# Patient Record
Sex: Female | Born: 1980 | Race: White | Hispanic: No | Marital: Single | Smoking: Never smoker
Health system: Southern US, Community
[De-identification: ages and names within clinical notes are randomized; demographics above are authoritative.]

---

## 2016-06-13 ENCOUNTER — Encounter (HOSPITAL_BASED_OUTPATIENT_CLINIC_OR_DEPARTMENT_OTHER): Payer: Self-pay | Admitting: Emergency Medicine

## 2016-06-13 ENCOUNTER — Emergency Department (HOSPITAL_BASED_OUTPATIENT_CLINIC_OR_DEPARTMENT_OTHER): Payer: Self-pay

## 2016-06-13 ENCOUNTER — Emergency Department (HOSPITAL_BASED_OUTPATIENT_CLINIC_OR_DEPARTMENT_OTHER)
Admission: EM | Admit: 2016-06-13 | Discharge: 2016-06-13 | Disposition: A | Discharge status: Home / Self Care | Payer: Self-pay | Attending: Emergency Medicine | Admitting: Emergency Medicine

## 2016-06-13 DIAGNOSIS — N201 Calculus of ureter: Secondary | ICD-10-CM | POA: Insufficient documentation

## 2016-06-13 LAB — COMPREHENSIVE METABOLIC PANEL
ALK PHOS: 73 U/L (ref 38–126)
ALT: 79 U/L — ABNORMAL HIGH (ref 14–54)
ANION GAP: 8 (ref 5–15)
AST: 37 U/L (ref 15–41)
Albumin: 4.3 g/dL (ref 3.5–5.0)
BUN: 14 mg/dL (ref 6–20)
CALCIUM: 9.2 mg/dL (ref 8.9–10.3)
CO2: 26 mmol/L (ref 22–32)
CREATININE: 0.79 mg/dL (ref 0.44–1.00)
Chloride: 104 mmol/L (ref 101–111)
Glucose, Bld: 142 mg/dL — ABNORMAL HIGH (ref 65–99)
Potassium: 4.2 mmol/L (ref 3.5–5.1)
SODIUM: 138 mmol/L (ref 135–145)
TOTAL PROTEIN: 7.4 g/dL (ref 6.5–8.1)
Total Bilirubin: 0.9 mg/dL (ref 0.3–1.2)

## 2016-06-13 LAB — CBC
HCT: 42.2 % (ref 36.0–46.0)
HEMOGLOBIN: 14.3 g/dL (ref 12.0–15.0)
MCH: 30.2 pg (ref 26.0–34.0)
MCHC: 33.9 g/dL (ref 30.0–36.0)
MCV: 89.2 fL (ref 78.0–100.0)
PLATELETS: 184 10*3/uL (ref 150–400)
RBC: 4.73 MIL/uL (ref 3.87–5.11)
RDW: 12.9 % (ref 11.5–15.5)
WBC: 7.3 10*3/uL (ref 4.0–10.5)

## 2016-06-13 LAB — URINALYSIS, ROUTINE W REFLEX MICROSCOPIC
BILIRUBIN URINE: NEGATIVE
Glucose, UA: NEGATIVE mg/dL
KETONES UR: NEGATIVE mg/dL
Leukocytes, UA: NEGATIVE
NITRITE: NEGATIVE
PH: 6 (ref 5.0–8.0)
Protein, ur: NEGATIVE mg/dL
Specific Gravity, Urine: 1.025 (ref 1.005–1.030)

## 2016-06-13 LAB — URINALYSIS, MICROSCOPIC (REFLEX)

## 2016-06-13 LAB — PREGNANCY, URINE: Preg Test, Ur: NEGATIVE

## 2016-06-13 LAB — LIPASE, BLOOD: Lipase: 18 U/L (ref 11–51)

## 2016-06-13 MED ORDER — HYDROCODONE-ACETAMINOPHEN 5-325 MG PO TABS: 1.0000 | ORAL_TABLET | Freq: Four times a day (QID) | ORAL | 0 refills | Status: AC | PRN

## 2016-06-13 MED ORDER — SODIUM CHLORIDE 0.9 % IV SOLN: INTRAVENOUS | Status: DC

## 2016-06-13 MED ORDER — SODIUM CHLORIDE 0.9 % IV BOLUS (SEPSIS)
1000.0000 mL | Freq: Once | INTRAVENOUS | Status: AC
  Administered 2016-06-13: 1000 mL via INTRAVENOUS

## 2016-06-13 MED ORDER — ONDANSETRON 4 MG PO TBDP: 4.0000 mg | ORAL_TABLET | Freq: Three times a day (TID) | ORAL | 1 refills | Status: AC | PRN

## 2016-06-13 MED ORDER — HYDROMORPHONE HCL 1 MG/ML IJ SOLN
1.0000 mg | Freq: Once | INTRAMUSCULAR | Status: AC
  Administered 2016-06-13: 1 mg via INTRAVENOUS
  Filled 2016-06-13: qty 1

## 2016-06-13 MED ORDER — NAPROXEN 500 MG PO TABS: 500.0000 mg | ORAL_TABLET | Freq: Two times a day (BID) | ORAL | 0 refills | Status: AC

## 2016-06-13 MED ORDER — ONDANSETRON HCL 4 MG/2ML IJ SOLN
4.0000 mg | Freq: Once | INTRAMUSCULAR | Status: AC
  Administered 2016-06-13: 4 mg via INTRAVENOUS
  Filled 2016-06-13: qty 2

## 2016-06-13 MED FILL — NAPROXEN 500 MG TABLET: 500 | 7 days supply | Qty: 14 | Fill #0

## 2016-06-13 MED FILL — ONDANSETRON ODT 4 MG TABLET: 4 | 4 days supply | Qty: 10 | Fill #0

## 2016-06-13 MED FILL — HYDROCODON-APAP 5-325: 5-325 | 2 days supply | Qty: 10 | Fill #0

## 2016-06-13 NOTE — ED Provider Notes (Signed)
[facility] Provider Note   CSN: [id number] Arrival date & time: 06/13/16  4098     History   Chief Complaint Chief Complaint  Patient presents with  . Abdominal Pain    HPI Alicia Nolan is a 36 y.o. female.  Patient with acute onset of right lower quadrant abdominal pain is 6 this morning that radiates to the back. No true flank pain. Associated with several episodes of nausea and vomiting. Patient denies any blood in the urine but does have an urgency feeling. No known history of kidney stones in the past. Pain is 8 out of 10.      History reviewed. No pertinent past medical history.  There are no active problems to display for this patient.   History reviewed. No pertinent surgical history.  OB History    No data available       Home Medications    Prior to Admission medications   Medication Sig Start Date End Date Taking? Authorizing Provider  HYDROcodone-acetaminophen (NORCO/VICODIN) 5-325 MG tablet Take 1-2 tablets by mouth every 6 (six) hours as needed for moderate pain. 06/13/16   Vanetta Mulders, MD  naproxen (NAPROSYN) 500 MG tablet Take 1 tablet (500 mg total) by mouth 2 (two) times daily. 06/13/16   Vanetta Mulders, MD  ondansetron (ZOFRAN ODT) 4 MG disintegrating tablet Take 1 tablet (4 mg total) by mouth every 8 (eight) hours as needed for nausea or vomiting. 06/13/16   Vanetta Mulders, MD    Family History No family history on file.  Social History Social History  Substance Use Topics  . Smoking status: Never Smoker  . Smokeless tobacco: Never Used  . Alcohol use Yes     Comment: weekly     Allergies   Patient has no known allergies.   Review of Systems Review of Systems  Constitutional: Negative for fever.  HENT: Negative for congestion.   Eyes: Negative for visual disturbance.  Respiratory: Negative for shortness of breath.   Cardiovascular: Negative for chest pain.  Gastrointestinal: Positive for abdominal pain, nausea  and vomiting.  Genitourinary: Positive for urgency. Negative for dysuria, frequency and hematuria.  Musculoskeletal: Positive for back pain.  Skin: Negative for rash.  Neurological: Negative for headaches.  Hematological: Does not bruise/bleed easily.  Psychiatric/Behavioral: Negative for confusion.     Physical Exam Updated Vital Signs BP (!) 145/73   Pulse 63   Temp 97.9 F (36.6 C) (Oral)   Resp 18   Ht  (1.702 m)   Wt 113.4 kg   LMP 05/28/2016   SpO2 98%   BMI 39.16 kg/m   Physical Exam  Constitutional: She is oriented to person, place, and time. She appears well-developed and well-nourished. No distress.  HENT:  Head: Normocephalic and atraumatic.  Mouth/Throat: Oropharynx is clear and moist.  Eyes: Conjunctivae and EOM are normal. Pupils are equal, round, and reactive to light.  Neck: Normal range of motion. Neck supple.  Cardiovascular: Normal rate, regular rhythm and normal heart sounds.   Pulmonary/Chest: Breath sounds normal. No respiratory distress.  Abdominal: Soft. Bowel sounds are normal. There is no tenderness.  Nontender to palpation right lower quadrant and right flank or right CVA area.  Musculoskeletal: Normal range of motion.  Neurological: She is alert and oriented to person, place, and time. No cranial nerve deficit or sensory deficit. She exhibits normal muscle tone. Coordination normal.  Skin: Skin is warm.  Nursing note and vitals reviewed.    ED Treatments / Results  Labs (all labs ordered are listed, but only abnormal results are displayed) Labs Reviewed  URINALYSIS, ROUTINE W REFLEX MICROSCOPIC - Abnormal; Notable for the following:       Result Value   APPearance CLOUDY (*)    Hgb urine dipstick LARGE (*)    All other components within normal limits  COMPREHENSIVE METABOLIC PANEL - Abnormal; Notable for the following:    Glucose, Bld 142 (*)    ALT 79 (*)    All other components within normal limits  URINALYSIS, MICROSCOPIC  (REFLEX) - Abnormal; Notable for the following:    Bacteria, UA FEW (*)    Squamous Epithelial / LPF 0-5 (*)    All other components within normal limits  PREGNANCY, URINE  LIPASE, BLOOD  CBC    EKG  EKG Interpretation None       Radiology Ct Renal Stone Study  Result Date: 06/13/2016 CLINICAL DATA:  Right flank pain, right lower quadrant pain EXAM: CT ABDOMEN AND PELVIS WITHOUT CONTRAST TECHNIQUE: Multidetector CT imaging of the abdomen and pelvis was performed following the standard protocol without IV contrast. COMPARISON:  None. FINDINGS: Lower chest: No acute abnormality. Hepatobiliary: Diffuse low attenuation of the liver as can be seen with hepatic steatosis. No focal patent mass. No gallstones, gallbladder wall thickening or biliary dilatation. Pancreas: Unremarkable. No pancreatic ductal dilatation or surrounding inflammatory changes. Spleen: Normal in size without focal abnormality. Adrenals/Urinary Tract: Adrenal glands are unremarkable. No renal mass. 4 mm distal right ureteral calculus. No hydronephrosis. No other renal, ureteral or bladder calculi. Bladder is unremarkable. Stomach/Bowel: Stomach is within normal limits. Appendix appears normal. No evidence of bowel wall thickening, distention, or inflammatory changes. Vascular/Lymphatic: No significant vascular findings are present. No enlarged abdominal or pelvic lymph nodes. Reproductive: Uterus and bilateral adnexa are unremarkable. Other: No abdominal wall hernia or abnormality. No abdominopelvic ascites. Musculoskeletal: No acute or significant osseous findings. IMPRESSION: 1. 4 mm distal right ureteral calculus without obstructive uropathy. Electronically Signed   By: Elige Ko   On: 06/13/2016 10:21    Procedures Procedures (including critical care time)  Medications Ordered in ED Medications  0.9 %  sodium chloride infusion (not administered)  sodium chloride 0.9 % bolus 1,000 mL (1,000 mLs Intravenous New  Bag/Given 06/13/16 1002)  ondansetron (ZOFRAN) injection 4 mg (4 mg Intravenous Given 06/13/16 1002)  HYDROmorphone (DILAUDID) injection 1 mg (1 mg Intravenous Given 06/13/16 1002)     Initial Impression / Assessment and Plan / ED Course  I have reviewed the triage vital signs and the nursing notes.  Pertinent labs & imaging results that were available during my care of the patient were reviewed by me and considered in my medical decision making (see chart for details).     A CT scan shows right-sided the ureteral stone 4 mm. Patient more comfortable. Patient referred to urology treated with Naprosyn and hydrocodone and Zofran. Odds are good that she'll be a past the stone. No complicating factors at this time.  Final Clinical Impressions(s) / ED Diagnoses   Final diagnoses:  Right ureteral stone    New Prescriptions New Prescriptions   HYDROCODONE-ACETAMINOPHEN (NORCO/VICODIN) 5-325 MG TABLET    Take 1-2 tablets by mouth every 6 (six) hours as needed for moderate pain.   NAPROXEN (NAPROSYN) 500 MG TABLET    Take 1 tablet (500 mg total) by mouth 2 (two) times daily.   ONDANSETRON (ZOFRAN ODT) 4 MG DISINTEGRATING TABLET    Take 1 tablet (4 mg total) by  mouth every 8 (eight) hours as needed for nausea or vomiting.     Vanetta Mulders, MD 06/13/16 1115

## 2016-06-13 NOTE — ED Triage Notes (Signed)
RLQ abd pain radiating to back since 6 this morning with N/V. Denies urinary symptoms.

## 2016-06-13 NOTE — Discharge Instructions (Signed)
Take the Naprosyn on a regular basis for the kidney stone. Supplement with hydrocodone as needed for pain. Take Zofran as needed for nausea and vomiting. Make appointment to follow-up with urology. Work note provided. Return for any new or worse symptoms.

## 2018-05-28 IMAGING — CT CT RENAL STONE PROTOCOL
2 of 4 series · 17 of 46 positions shown, 19 images · non-contrast
Comparison: None.

CLINICAL DATA: Right flank pain, right lower quadrant pain

EXAM:
CT ABDOMEN AND PELVIS WITHOUT CONTRAST
TECHNIQUE: Multidetector CT imaging of the abdomen and pelvis was performed
following the standard protocol without IV contrast.

[Series 2: axial st · axial · 0.98mm/px · z∈[-492,-52]mm · 14 of 98 slices shown, 16 images]
[im 5/98  soft-tissue]
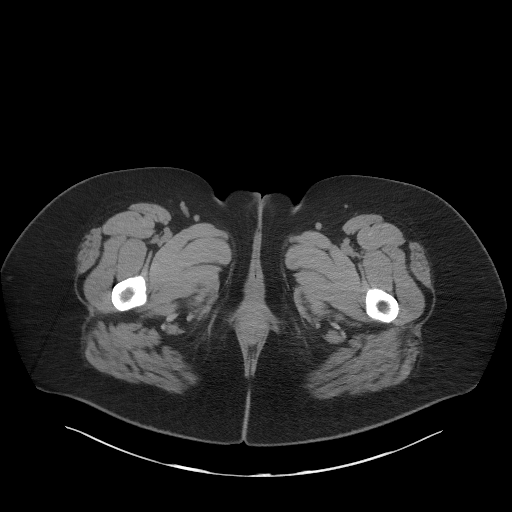
[im 5/98  bone]
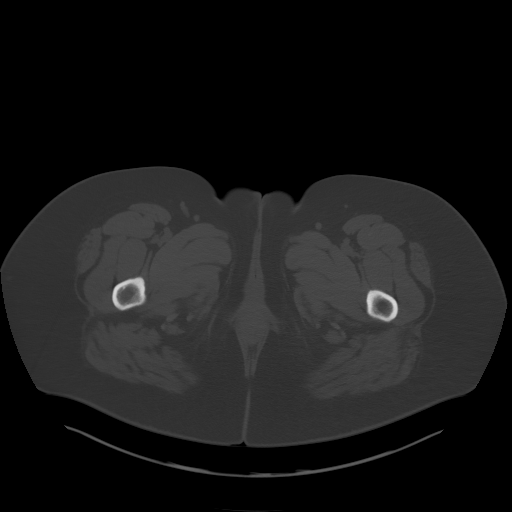
[im 13/98  soft-tissue]
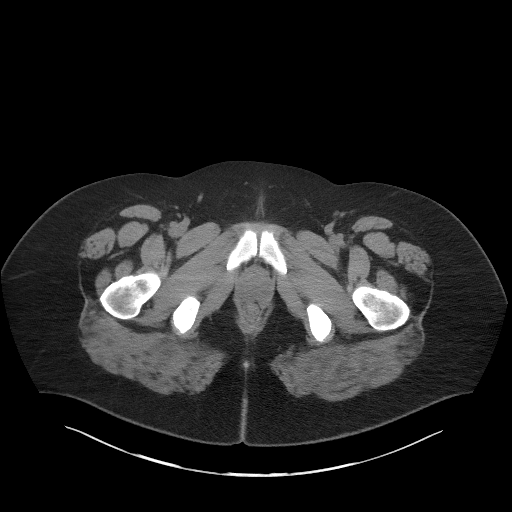
[im 17/98  soft-tissue]
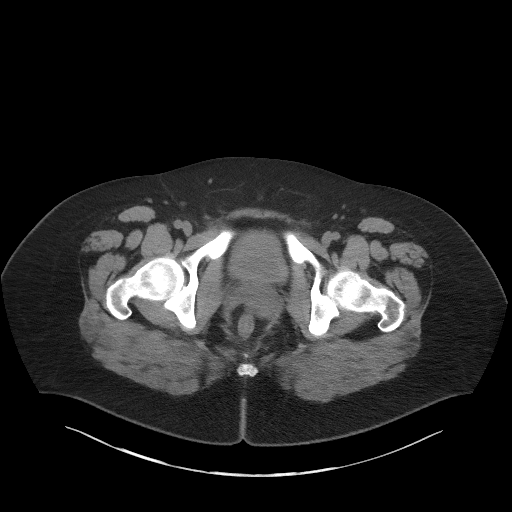
[im 26/98  soft-tissue]
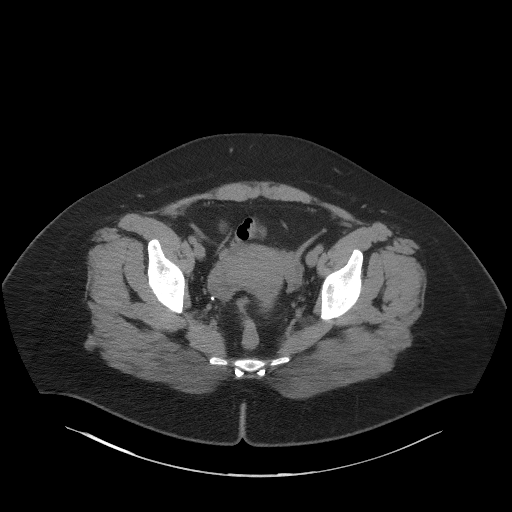
[im 34/98  soft-tissue]
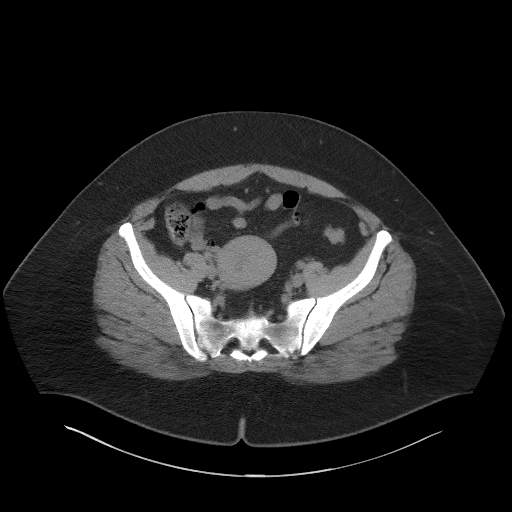
[im 38/98  soft-tissue]
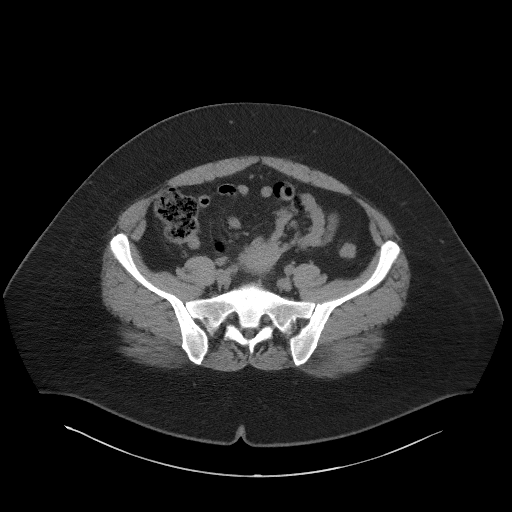
[im 47/98  soft-tissue]
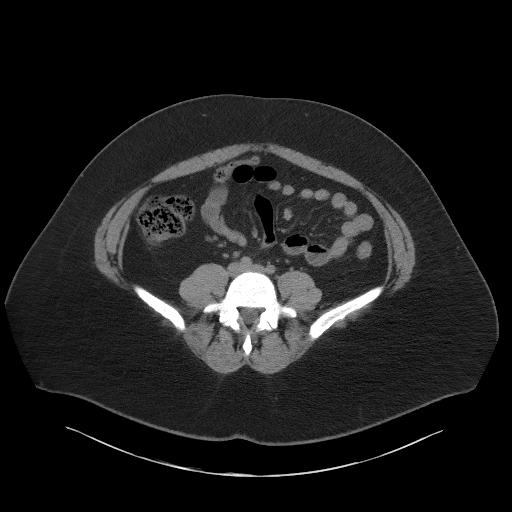
[im 51/98  soft-tissue]
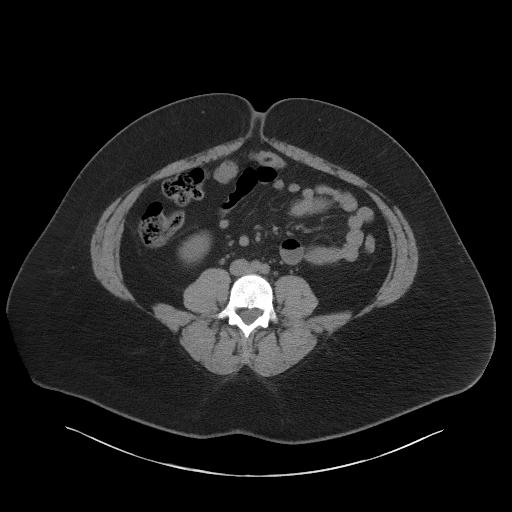
[im 60/98  soft-tissue]
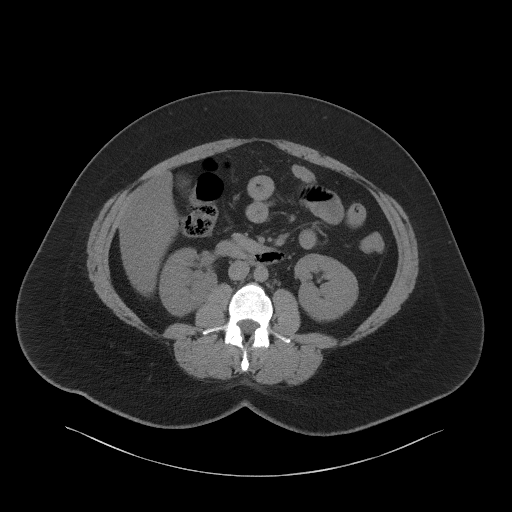
[im 60/98  bone]
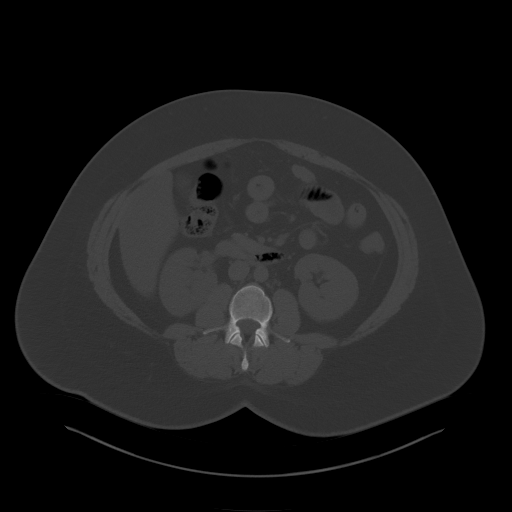
[im 64/98  soft-tissue]
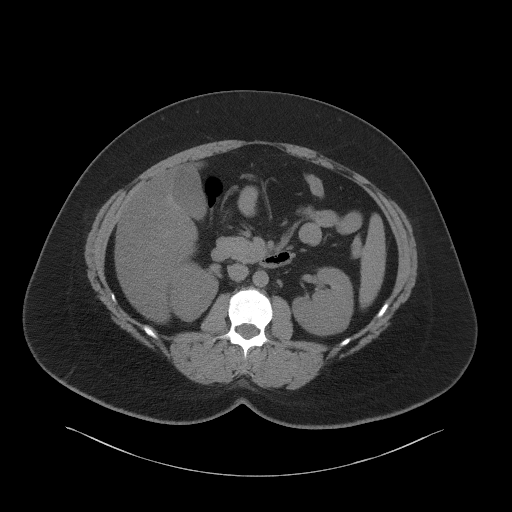
[im 72/98  soft-tissue]
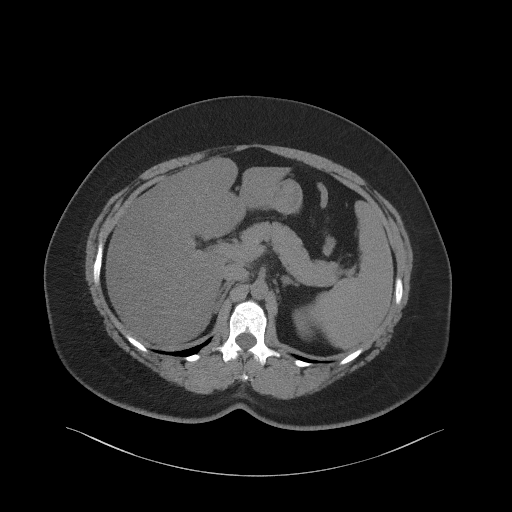
[im 81/98  soft-tissue]
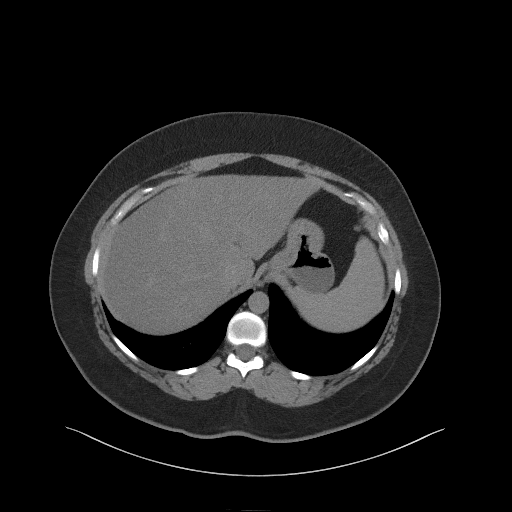
[im 85/98  soft-tissue]
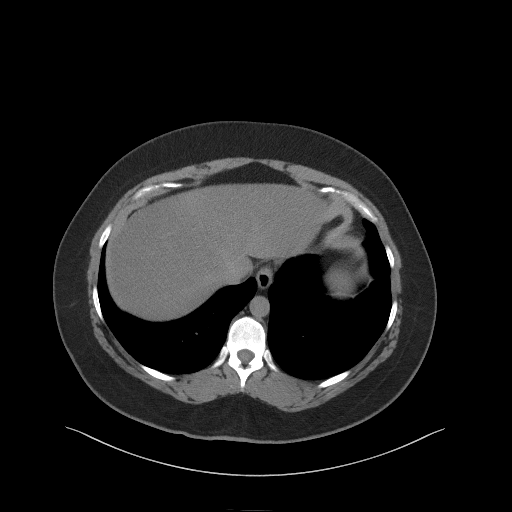
[im 93/98  soft-tissue]
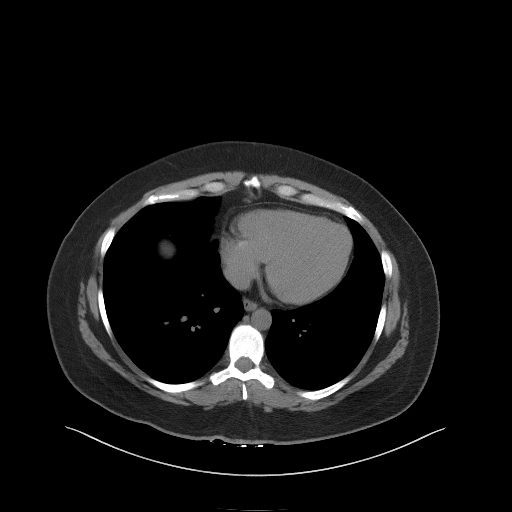

[Series 5: coronal st · coronal · 0.88mm/px · 3 of 122 slices shown]
[im 41/122  soft-tissue]
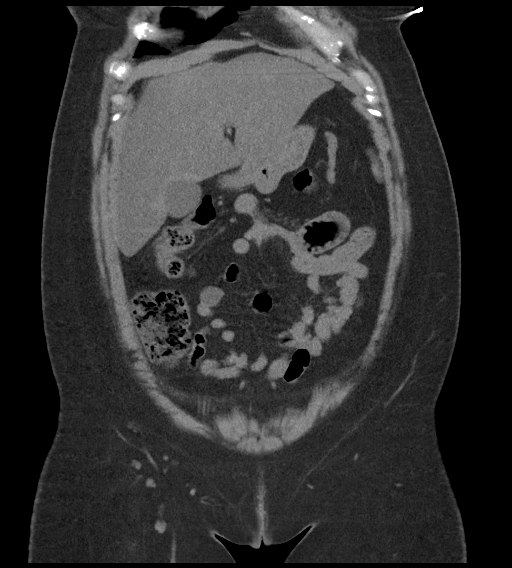
[im 54/122  soft-tissue]
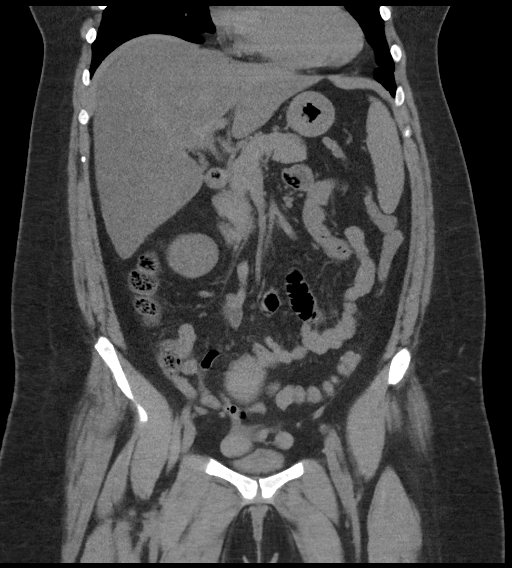
[im 68/122  soft-tissue]
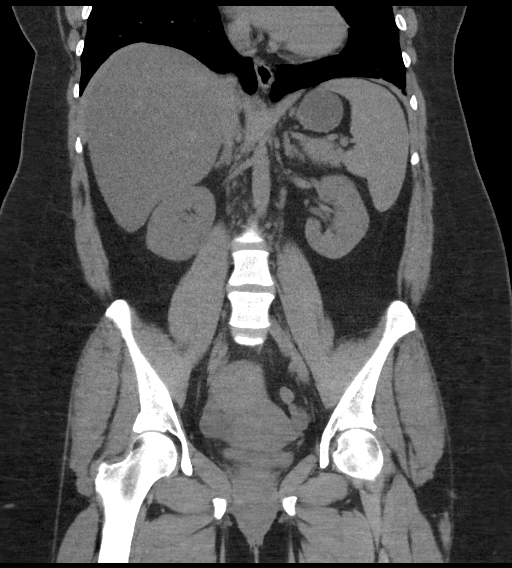

[17 of 46 positions shown; findings below may reference images not displayed]

FINDINGS: Lower chest: No acute abnormality.

Hepatobiliary: Diffuse low attenuation of the liver as can be seen
with hepatic steatosis. No focal patent mass. No gallstones,
gallbladder wall thickening or biliary dilatation.

Pancreas: Unremarkable. No pancreatic ductal dilatation or
surrounding inflammatory changes.

Spleen: Normal in size without focal abnormality.

Adrenals/Urinary Tract: Adrenal glands are unremarkable. No renal
mass. 4 mm distal right ureteral calculus. No hydronephrosis. No
other renal, ureteral or bladder calculi. Bladder is unremarkable.

Stomach/Bowel: Stomach is within normal limits. Appendix appears
normal. No evidence of bowel wall thickening, distention, or
inflammatory changes.

Vascular/Lymphatic: No significant vascular findings are present. No
enlarged abdominal or pelvic lymph nodes.

Reproductive: Uterus and bilateral adnexa are unremarkable.

Other: No abdominal wall hernia or abnormality. No abdominopelvic
ascites.

Musculoskeletal: No acute or significant osseous findings.
IMPRESSION: 1. 4 mm distal right ureteral calculus without obstructive uropathy.
